# Patient Record
Sex: Female | Born: 2000 | Race: White | Hispanic: No | Marital: Single | Smoking: Never smoker
Health system: Southern US, Community
[De-identification: ages and names within clinical notes are randomized; demographics above are authoritative.]

## PROBLEM LIST (undated history)

## (undated) DIAGNOSIS — L709 Acne, unspecified: Secondary | ICD-10-CM

## (undated) HISTORY — DX: Acne, unspecified: L70.9

---
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
## 2022-04-20 NOTE — Progress Notes (Signed)
? ?New Patient Office Visit ? ?Subjective   ? ?Patient ID: Courtney Phillips, female    DOB: [birth year]  Age: 21 y.o. MRN: [id number] ? ?CC:  ?Chief Complaint  ?Patient presents with  ? Establish Care  ?  Np. Est care. BC refill. No main concerns.   ? ? ?HPI ?Courtney Phillips presents for new patient visit to establish care.  Introduced to Publishing rights manager role and practice setting.  All questions answered.  Discussed provider/patient relationship and expectations. ? ?She is currently taking OCP and has been on this since 2018 when she started accutane. She is curious if she can go on a lower dose of the same brand of her birth control.  ? ?She has intermittent urinary urgency that started about a year ago. She states that this has happened about 4-5 times over the last year. Endorses dysuria at times. Denies blood in her urine. She states that one time, she developed the urinary urgency feeling, and wasn't able to make it to the bathroom in time and had an episode of incontinence.  ? ?Depression and Anxiety Screen Done: ? ? ?  04/22/2022  ?  2:59 PM  ?Depression screen PHQ 2/9  ?Decreased Interest 0  ?Down, Depressed, Hopeless 0  ?PHQ - 2 Score 0  ? ? ?  04/22/2022  ?  3:18 PM  ?GAD 7 : Generalized Anxiety Score  ?Nervous, Anxious, on Edge 0  ?Control/stop worrying 0  ?Worry too much - different things 0  ?Trouble relaxing 0  ?Restless 0  ?Easily annoyed or irritable 0  ?Afraid - awful might happen 0  ?Total GAD 7 Score 0  ?Anxiety Difficulty Not difficult at all  ?  ? ?Outpatient Encounter Medications as of 04/22/2022  ?Medication Sig  ? Norgestimate-Ethinyl Estradiol Triphasic (ORTHO TRI-CYCLEN, 28,) 0.18/0.215/0.25 MG-35 MCG tablet Take 1 tablet by mouth daily.  ? [DISCONTINUED] norgestimate-ethinyl estradiol (ORTHO-CYCLEN) 0.25-35 MG-MCG tablet Take by mouth.  ? ?No facility-administered encounter medications on file as of 04/22/2022.  ? ? ?Past Medical History:  ?Diagnosis Date  ? Acne   ? ? ?History reviewed. No pertinent  surgical history. ? ?Family History  ?Problem Relation Age of Onset  ? Arrhythmia Mother   ? ? ?Social History  ? ?Socioeconomic History  ? Marital status: Single  ?  Spouse name: Not on file  ? Number of children: Not on file  ? Years of education: Not on file  ? Highest education level: Not on file  ?Occupational History  ? Not on file  ?Tobacco Use  ? Smoking status: Never  ? Smokeless tobacco: Never  ?Vaping Use  ? Vaping Use: Never used  ?Substance and Sexual Activity  ? Alcohol use: Never  ? Drug use: Never  ? Sexual activity: Never  ?  Birth control/protection: Pill  ?Other Topics Concern  ? Not on file  ?Social History Narrative  ? Not on file  ? ?Social Determinants of Health  ? ?Financial Resource Strain: Not on file  ?Food Insecurity: Not on file  ?Transportation Needs: Not on file  ?Physical Activity: Not on file  ?Stress: Not on file  ?Social Connections: Not on file  ?Intimate Partner Violence: Not on file  ? ? ?Review of Systems  ?Constitutional: Negative.   ?HENT: Negative.    ?Eyes: Negative.   ?Respiratory: Negative.    ?Cardiovascular: Negative.   ?Gastrointestinal: Negative.   ?Genitourinary:  Positive for urgency (intermittently). Negative for dysuria, frequency and hematuria.  ?Musculoskeletal: Negative.   ?  Skin: Negative.   ?Neurological: Negative.   ?Psychiatric/Behavioral: Negative.    ? ?  ?Objective   ? ?BP 139/76 (BP Location: Right Arm, Patient Position: Sitting, Cuff Size: Normal)   Pulse 99   Temp (!) 97.5 ?F (36.4 ?C) (Temporal)   Ht 5\' 7"  (1.702 m)   Wt 135 lb 9.6 oz (61.5 kg)   LMP 04/21/2022 (Exact Date)   SpO2 97%   BMI 21.24 kg/m?  ? ?Physical Exam ?Vitals and nursing note reviewed.  ?Constitutional:   ?   General: She is not in acute distress. ?   Appearance: Normal appearance.  ?HENT:  ?   Head: Normocephalic and atraumatic.  ?   Right Ear: Tympanic membrane, ear canal and external ear normal.  ?   Left Ear: Tympanic membrane, ear canal and external ear normal.  ?    Nose: Nose normal.  ?   Mouth/Throat:  ?   Mouth: Mucous membranes are moist.  ?   Pharynx: Oropharynx is clear.  ?Eyes:  ?   Conjunctiva/sclera: Conjunctivae normal.  ?Cardiovascular:  ?   Rate and Rhythm: Normal rate and regular rhythm.  ?   Pulses: Normal pulses.  ?   Heart sounds: Normal heart sounds.  ?Pulmonary:  ?   Effort: Pulmonary effort is normal.  ?   Breath sounds: Normal breath sounds.  ?Abdominal:  ?   Palpations: Abdomen is soft.  ?   Tenderness: There is no abdominal tenderness.  ?Musculoskeletal:     ?   General: Normal range of motion.  ?   Cervical back: Normal range of motion. No tenderness.  ?   Right lower leg: No edema.  ?   Left lower leg: No edema.  ?Lymphadenopathy:  ?   Cervical: No cervical adenopathy.  ?Skin: ?   General: Skin is warm and dry.  ?Neurological:  ?   General: No focal deficit present.  ?   Mental Status: She is alert and oriented to person, place, and time.  ?   Cranial Nerves: No cranial nerve deficit.  ?   Coordination: Coordination normal.  ?   Gait: Gait normal.  ?Psychiatric:     ?   Mood and Affect: Mood normal.     ?   Behavior: Behavior normal.     ?   Thought Content: Thought content normal.     ?   Judgment: Judgment normal.  ?  ? ?Assessment & Plan:  ? ?Problem List Items Addressed This Visit   ? ?  ? Other  ? Urinary urgency  ?  Intermittent urinary urgency that happened about 4 times in the last year.  She declines labs and U/A today.  Discussed trying to limit spicy foods and caffeine.  Also discussed Kegel exercises that she can do daily.  Follow-up if symptoms worsen or with any concerns. ? ?  ?  ? Birth control counseling  ?  She is interested in switching to a lower dose of the same brand birth control that she is taking.  We will start Ortho Tri-Cyclen.  Follow-up with any side effects or concerns. ? ?  ?  ? ?Other Visit Diagnoses   ? ? Routine general medical examination at a health care facility    -  Primary  ? Health maintenance reviewed and updated.   She declines labs or urine sample today.  Follow-up in 1 year  ? ?  ? ?LABORATORY TESTING:  ?- Pap smear: not applicable ? ?IMMUNIZATIONS:   ?-  Tdap: Tetanus vaccination status reviewed: last tetanus booster within 10 years. ?- Influenza: Postponed to flu season ?- Pneumovax: Not applicable ?- Prevnar: Not applicable ?- HPV:  unsure- will look in [facility] ?- Zostavax vaccine: Not applicable ? ?SCREENING: ?-Mammogram: Not applicable  ?- Colonoscopy: Not applicable  ?- Bone Density: Not applicable  ?-Hearing Test: Not applicable  ?-Spirometry: Not applicable  ? ?PATIENT COUNSELING:   ?Advised to take 1 mg of folate supplement per day if capable of pregnancy.  ? ?Sexuality: Discussed sexually transmitted diseases, partner selection, use of condoms, avoidance of unintended pregnancy  and contraceptive alternatives.  ? ?Advised to avoid cigarette smoking. ? ?I discussed with the patient that most people either abstain from alcohol or drink within safe limits (<=14/week and <=4 drinks/occasion for males, <=7/weeks and <= 3 drinks/occasion for females) and that the risk for alcohol disorders and other health effects rises proportionally with the number of drinks per week and how often a drinker exceeds daily limits. ? ?Discussed cessation/primary prevention of drug use and availability of treatment for abuse.  ? ?Diet: Encouraged to adjust caloric intake to maintain  or achieve ideal body weight, to reduce intake of dietary saturated fat and total fat, to limit sodium intake by avoiding high sodium foods and not adding table salt, and to maintain adequate dietary potassium and calcium preferably from fresh fruits, vegetables, and low-fat dairy products.   ? ?stressed the importance of regular exercise ? ?Injury prevention: Discussed safety belts, safety helmets, smoke detector, smoking near bedding or upholstery.  ? ?Dental health: Discussed importance of regular tooth brushing, flossing, and dental visits.  ? ? ?NEXT  PREVENTATIVE PHYSICAL DUE IN 1 YEAR. ? ? ?Return in about 1 year (around 04/23/2023) for CPE.  ? ?Gerre ScullLauren A Makayla Lanter, NP ? ? ?

## 2022-04-22 ENCOUNTER — Encounter: Payer: Self-pay | Admitting: Nurse Practitioner

## 2022-04-22 ENCOUNTER — Ambulatory Visit (INDEPENDENT_AMBULATORY_CARE_PROVIDER_SITE_OTHER): Payer: BC Managed Care – PPO | Admitting: Nurse Practitioner

## 2022-04-22 VITALS — BP 139/76 | HR 99 | Temp 97.5°F | Ht 67.0 in | Wt 135.6 lb

## 2022-04-22 DIAGNOSIS — Z0000 Encounter for general adult medical examination without abnormal findings: Secondary | ICD-10-CM | POA: Diagnosis not present

## 2022-04-22 DIAGNOSIS — R3915 Urgency of urination: Secondary | ICD-10-CM | POA: Diagnosis not present

## 2022-04-22 DIAGNOSIS — Z3009 Encounter for other general counseling and advice on contraception: Secondary | ICD-10-CM

## 2022-04-22 MED ORDER — NORGESTIM-ETH ESTRAD TRIPHASIC 0.18/0.215/0.25 MG-35 MCG PO TABS: 1.0000 | ORAL_TABLET | Freq: Every day | ORAL | 3 refills | Status: DC

## 2022-04-22 NOTE — Patient Instructions (Signed)
It was great to see you! ? ?Start the kegel exercises several times a day.  ? ?I have changed your birth control as we discussed, if you have any side effects or concerns, you can call or send a mychart message.  ? ?Let's follow-up in 1 year, sooner if you have concerns. ? ?If a referral was placed today, you will be contacted for an appointment. Please note that routine referrals can sometimes take up to 3-4 weeks to process. Please call our office if you haven't heard anything after this time frame. ? ?Take care, ? ?Rodman Pickle, NP ? ?

## 2022-04-22 NOTE — Assessment & Plan Note (Signed)
She is interested in switching to a lower dose of the same brand birth control that she is taking.  We will start Ortho Tri-Cyclen.  Follow-up with any side effects or concerns. ?

## 2022-04-22 NOTE — Assessment & Plan Note (Signed)
Intermittent urinary urgency that happened about 4 times in the last year.  She declines labs and U/A today.  Discussed trying to limit spicy foods and caffeine.  Also discussed Kegel exercises that she can do daily.  Follow-up if symptoms worsen or with any concerns. ?

## 2022-06-26 DIAGNOSIS — N10 Acute tubulo-interstitial nephritis: Secondary | ICD-10-CM | POA: Diagnosis not present

## 2023-01-06 ENCOUNTER — Ambulatory Visit: Payer: BC Managed Care – PPO | Admitting: Nurse Practitioner

## 2023-01-06 ENCOUNTER — Encounter: Payer: Self-pay | Admitting: Nurse Practitioner

## 2023-01-06 VITALS — BP 100/58 | HR 108 | Temp 98.6°F | Ht 67.0 in | Wt 141.8 lb

## 2023-01-06 DIAGNOSIS — J029 Acute pharyngitis, unspecified: Secondary | ICD-10-CM | POA: Diagnosis not present

## 2023-01-06 DIAGNOSIS — J101 Influenza due to other identified influenza virus with other respiratory manifestations: Secondary | ICD-10-CM | POA: Diagnosis not present

## 2023-01-06 LAB — POC INFLUENZA A&B (BINAX/QUICKVUE)
Influenza A, POC: POSITIVE — AB
Influenza B, POC: NEGATIVE

## 2023-01-06 MED ORDER — OSELTAMIVIR PHOSPHATE 75 MG PO CAPS: 75.0000 mg | ORAL_CAPSULE | Freq: Two times a day (BID) | ORAL | 0 refills | Status: AC

## 2023-01-06 NOTE — Patient Instructions (Signed)
It was great to see you!  Start tamiflu twice a day for 5 days with food.   Alternate ibuprofen 400mg  and tylenol 500mg  every 2-3 hours to help with your fever and body aches.   You are contagious until you are fever free for 48 hours with no medication.   Let's follow-up if your symptoms worsen or don't improve  Take care,  Vance Peper, NP

## 2023-01-06 NOTE — Progress Notes (Signed)
Acute Office Visit  Subjective:     Patient ID: Courtney Phillips, female    DOB: August 04, 2001, 22 y.o.   MRN: [id number]  Chief Complaint  Patient presents with   Acute Visit     Sore throat ,chills , fever , coughing , body aches ,  started having  tthis on Tuesday  and taking otc medication to help , hasn't   home test covid , having headache     HPI Patient is in today for sore throat, fever, and body aches that started 2 days ago.   UPPER RESPIRATORY TRACT INFECTION  Fever: yes Cough: yes Shortness of breath: no Wheezing: no Chest pain: yes, with cough Chest tightness: no Chest congestion: no Nasal congestion: yes Runny nose: yes Post nasal drip: yes Sneezing: no Sore throat: yes Swollen glands: no Sinus pressure: no Headache: yes Face pain: no Toothache: no Ear pain: yes left Ear pressure: no bilateral Eyes red/itching:no Eye drainage/crusting: no  Vomiting: no Rash: no Fatigue: yes Sick contacts: yes - boyfriend Strep contacts: no  Context: worse Recurrent sinusitis: no Relief with OTC cold/cough medications: yes  Treatments attempted: tylenol, ibuprofen   ROS See pertinent positives and negatives per HPI.     Objective:    BP (!) 100/58   Pulse (!) 108   Temp 98.6 F (37 C)   Ht 5\' 7"  (1.702 m)   Wt 141 lb 12.8 oz (64.3 kg)   SpO2 97%   BMI 22.21 kg/m     Physical Exam Vitals and nursing note reviewed.  Constitutional:      General: She is not in acute distress.    Appearance: Normal appearance.  HENT:     Head: Normocephalic.     Right Ear: Tympanic membrane, ear canal and external ear normal.     Left Ear: Tympanic membrane, ear canal and external ear normal.  Eyes:     Conjunctiva/sclera: Conjunctivae normal.  Cardiovascular:     Rate and Rhythm: Normal rate and regular rhythm.     Pulses: Normal pulses.     Heart sounds: Normal heart sounds.  Pulmonary:     Effort: Pulmonary effort is normal.     Breath sounds: Normal breath  sounds.  Musculoskeletal:     Cervical back: Normal range of motion and neck supple. No tenderness.  Lymphadenopathy:     Cervical: No cervical adenopathy.  Skin:    General: Skin is warm.  Neurological:     General: No focal deficit present.     Mental Status: She is alert and oriented to person, place, and time.  Psychiatric:        Mood and Affect: Mood normal.        Behavior: Behavior normal.        Thought Content: Thought content normal.        Judgment: Judgment normal.     Results for orders placed or performed in visit on 01/06/23  POC Influenza A&B(BINAX/QUICKVUE)  Result Value Ref Range   Influenza A, POC Positive (A) Negative   Influenza B, POC Negative Negative        Assessment & Plan:   Problem List Items Addressed This Visit   None Visit Diagnoses     Influenza A    -  Primary   Will treat with tamiflu 75mg  BID x5 days. Encouarge rest, fluids. Alternate tylenol/ibuprofen prn fever, body aches. School note given.   Relevant Medications   oseltamivir (TAMIFLU) 75 MG capsule  Sore throat       POC flu positive A. Can alternate tylenol and ibuprofen as needed for sore throat. Encourage rest, fluids.   Relevant Orders   POC Influenza A&B(BINAX/QUICKVUE) (Completed)       Meds ordered this encounter  Medications   oseltamivir (TAMIFLU) 75 MG capsule    Sig: Take 1 capsule (75 mg total) by mouth 2 (two) times daily.    Dispense:  10 capsule    Refill:  0    Return if symptoms worsen or fail to improve.  Charyl Dancer, NP

## 2023-03-01 DIAGNOSIS — Z01419 Encounter for gynecological examination (general) (routine) without abnormal findings: Secondary | ICD-10-CM | POA: Diagnosis not present

## 2023-03-01 DIAGNOSIS — Z6820 Body mass index (BMI) 20.0-20.9, adult: Secondary | ICD-10-CM | POA: Diagnosis not present

## 2023-03-17 ENCOUNTER — Other Ambulatory Visit: Payer: Self-pay | Admitting: Nurse Practitioner

## 2023-03-17 MED ORDER — NORGESTIM-ETH ESTRAD TRIPHASIC 0.18/0.215/0.25 MG-35 MCG PO TABS: 1.0000 | ORAL_TABLET | Freq: Every day | ORAL | 0 refills | Status: AC

## 2023-03-17 NOTE — Telephone Encounter (Signed)
Refill request for  Tri Estravlla  LR 04/22/22, #84, 3 rf LOV 04/22/22 FOV none scheduled.    Please review and advise.  Thanks. Dm/cma

## 2023-03-22 NOTE — Telephone Encounter (Signed)
Patient reports she is changing birth control methods and her OB-GYN is taking care of it. Dm/cma

## 2023-04-01 DIAGNOSIS — Z3202 Encounter for pregnancy test, result negative: Secondary | ICD-10-CM | POA: Diagnosis not present

## 2023-04-01 DIAGNOSIS — Z30430 Encounter for insertion of intrauterine contraceptive device: Secondary | ICD-10-CM | POA: Diagnosis not present

## 2023-05-13 DIAGNOSIS — Z30431 Encounter for routine checking of intrauterine contraceptive device: Secondary | ICD-10-CM | POA: Diagnosis not present

## 2023-06-07 ENCOUNTER — Other Ambulatory Visit: Payer: Self-pay | Admitting: Nurse Practitioner

## 2023-06-07 NOTE — Telephone Encounter (Signed)
Requesting: TRI-ESTARYLLA TABLET  Last Visit: 01/06/2023 Next Visit: Visit date not found Last Refill: 03/17/2023  Please Advise   Per last note on 03/17/23 Gerre Scull, NP  to Way[facility] Cera, [facility]    03/17/23  1:48 PM Needs appointment (cpe) for future refills.

## 2023-06-15 DIAGNOSIS — Z30431 Encounter for routine checking of intrauterine contraceptive device: Secondary | ICD-10-CM | POA: Diagnosis not present

## 2024-12-14 ENCOUNTER — Ambulatory Visit: Payer: Self-pay

## 2024-12-14 NOTE — Telephone Encounter (Signed)
 FYI Only or Action Required?: FYI only for provider: appointment scheduled on 12/17/2024 at 8 AM.  Patient was last seen in primary care on 01/06/2023 by Nedra Tinnie LABOR, NP.  Called Nurse Triage reporting Hemorrhoids.  Symptoms began several months ago.  Interventions attempted: Rest, hydration, or home remedies.  Symptoms are: unchanged.  Triage Disposition: See PCP Within 2 Weeks  Patient/caregiver understands and will follow disposition?: Yes  Message from [facility] sent at 12/14/2024  8:10 AM EST  Summary: Hemmorhoid   Reason for Triage: hemmorhoid, really uncomfortable, can feel the bump, no bleeding   [phone] (M)         Reason for Disposition  Painless lump in rectal area  Answer Assessment - Initial Assessment Questions Patient reports painless lump to rectal area. Patient concerned for possible hemorrhoid. Scheduled to see provider in PCP office on 12/17/2024 at 8 AM.   1. SYMPTOM:  What's the main symptom you're concerned about? (e.g., pain, itching, swelling, rash)     Patient reports possible hemorrhoid-reports feeling a bump. No bleeding 2. ONSET: When did the hemorrhoid  start?     4-5 years ago 3. RECTAL PAIN: Do you have any pain around your rectum? How bad is the pain?  (Scale 0-10; or none, mild, moderate, severe)     no 4. RECTAL ITCHING: Do you have any itching in this area? How bad is the itching?  (Scale 0-10; or none, mild, moderate, severe)     no 5. CONSTIPATION: Do you have constipation? If Yes, ask: How often do you have a bowel movement (BM)?  (Normal range: 3 times a day to every 3 days)  When was your last BM?       At times-this morning 6. CAUSE: What do you think is causing the anus symptoms?     Concerned for possible hemorrhoid 7. OTHER SYMPTOMS: Do you have any other symptoms?  (e.g., abdomen pain, fever, rectal bleeding, vomiting)     no 8. PREGNANCY: Is there any chance you are pregnant? When was your  last menstrual period?     no  Protocols used: Rectal Symptoms-A-AH

## 2024-12-14 NOTE — Telephone Encounter (Signed)
 Noted

## 2024-12-17 ENCOUNTER — Ambulatory Visit (INDEPENDENT_AMBULATORY_CARE_PROVIDER_SITE_OTHER): Admitting: Emergency Medicine

## 2024-12-17 ENCOUNTER — Encounter: Payer: Self-pay | Admitting: Emergency Medicine

## 2024-12-17 VITALS — BP 114/70 | HR 90 | Resp 16 | Ht 67.0 in | Wt 140.1 lb

## 2024-12-17 DIAGNOSIS — K644 Residual hemorrhoidal skin tags: Secondary | ICD-10-CM

## 2024-12-17 MED ORDER — HYDROCORT-PRAMOXINE (PERIANAL) 1-1 % EX FOAM: 1.0000 | Freq: Two times a day (BID) | CUTANEOUS | 1 refills | Status: AC

## 2024-12-17 NOTE — Progress Notes (Signed)
" ° ° °  Assessment & Plan:   Assessment & Plan External hemorrhoid Chronic Not thrombosd Trial proctofoam, could consider nifedipine cream as a 2nd line Other recommendations as below, with referral placed to discuss banding/surgical options Orders:   Ambulatory referral to General Surgery   hydrocortisone-pramoxine (PROCTOFOAM-HC) rectal foam; Place 1 applicator rectally 2 (two) times daily.   Patient Instructions  We will start with proctofoam Can step up to a compounded medication if this does not help in the next couple of weeks Referral has been placed to general surgery  General recommendations for hemorrhoids: 20-30g insoluble fiber daily with 1.5-2L of water (at least more on days when you work out) daily. Can take 6 weeks for fiber to make a difference so stick with it Continue regular physical exercise as you normally do Avoid lingering on the toilet for any longer than necessary Consider trying squatty potty or something similar for bowel movements   Corean Geralds, MSPAS, PA-C    Subjective:   Chief Complaint  Patient presents with   Hemorrhoids    Pt noticed a hemorrhoid a while ago. No pain.      HPI: Courtney Phillips is a 23 y.o. female presenting on 12/17/2024 with report of chronic external hemorrhoid present since her early teens. Rarely irritation, never bleeding or severe pain. She has tried OTC remedies without relief, and inquires about possible procedures or other treatments for resolution.  Varied diet Good water intake Vigorous exercise regularly No prior rectal insertive activities    ROS: Negative unless specifically indicated above in HPI.   Relevant past medical history reviewed and updated as indicated.   Allergies and medications reviewed and updated.  Current Medications[1]  Allergies[2]  Social History[3]   Objective:   Vitals:   12/17/24 0759  BP: 114/70  Pulse: 90  Resp: 16  Height: 5' 7 (1.702 m)  Weight: 140 lb 1.3  oz (63.5 kg)  SpO2: 98%  BMI (Calculated): 21.93     Appears well, INAD Sclera anicteric Oral mucosa moist Normal resp effort and excursion Grade 3 external hemorrhoid at posterior wall, non-thrombosed/non-tender.      [1]  Current Outpatient Medications:    Norgestimate-Ethinyl Estradiol Triphasic (TRI-ESTARYLLA) 0.18/0.215/0.25 MG-35 MCG tablet, Take 1 tablet by mouth daily., Disp: 84 tablet, Rfl: 0   acetaminophen (TYLENOL) 325 MG tablet, Take 650 mg by mouth every 6 (six) hours as needed. (Patient not taking: Reported on 12/17/2024), Disp: , Rfl:    ibuprofen (ADVIL) 400 MG tablet, Take 400 mg by mouth every 6 (six) hours as needed. (Patient not taking: Reported on 12/17/2024), Disp: , Rfl:    oseltamivir  (TAMIFLU ) 75 MG capsule, Take 1 capsule (75 mg total) by mouth 2 (two) times daily. (Patient not taking: Reported on 12/17/2024), Disp: 10 capsule, Rfl: 0 [2] No Known Allergies [3]  Social History Tobacco Use   Smoking status: Never   Smokeless tobacco: Never  Vaping Use   Vaping status: Never Used  Substance Use Topics   Alcohol use: Never   Drug use: Never   "

## 2024-12-17 NOTE — Patient Instructions (Addendum)
 We will start with proctofoam Can step up to a compounded medication if this does not help in the next couple of weeks Referral has been placed to general surgery  General recommendations for hemorrhoids: 20-30g insoluble fiber daily with 1.5-2L of water (at least more on days when you work out) daily. Can take 6 weeks for fiber to make a difference so stick with it Continue regular physical exercise as you normally do Avoid lingering on the toilet for any longer than necessary Consider trying squatty potty or something similar for bowel movements
